# Patient Record
Sex: Male | Born: 1945 | Race: White | Hispanic: No | Marital: Single | Smoking: Current every day smoker
Health system: Southern US, Community
[De-identification: ages and names within clinical notes are randomized; demographics above are authoritative.]

## PROBLEM LIST (undated history)

## (undated) DIAGNOSIS — E119 Type 2 diabetes mellitus without complications: Secondary | ICD-10-CM

## (undated) DIAGNOSIS — I10 Essential (primary) hypertension: Secondary | ICD-10-CM

---

## 2004-10-29 ENCOUNTER — Ambulatory Visit: Payer: Self-pay | Admitting: Urology

## 2004-12-04 ENCOUNTER — Ambulatory Visit: Payer: Self-pay | Admitting: Radiation Oncology

## 2004-12-24 ENCOUNTER — Ambulatory Visit: Payer: Self-pay | Admitting: Radiation Oncology

## 2005-02-20 ENCOUNTER — Ambulatory Visit: Payer: Self-pay | Admitting: Unknown Physician Specialty

## 2005-12-03 ENCOUNTER — Ambulatory Visit: Payer: Self-pay | Admitting: Radiation Oncology

## 2005-12-24 ENCOUNTER — Ambulatory Visit: Payer: Self-pay | Admitting: Radiation Oncology

## 2006-09-09 ENCOUNTER — Ambulatory Visit: Payer: Self-pay | Admitting: Oncology

## 2006-09-23 ENCOUNTER — Ambulatory Visit: Payer: Self-pay | Admitting: Oncology

## 2007-09-15 ENCOUNTER — Ambulatory Visit: Payer: Self-pay | Admitting: Radiation Oncology

## 2007-09-24 ENCOUNTER — Ambulatory Visit: Payer: Self-pay | Admitting: Radiation Oncology

## 2007-11-02 ENCOUNTER — Emergency Department: Payer: Self-pay | Admitting: Emergency Medicine

## 2008-01-22 ENCOUNTER — Ambulatory Visit: Payer: Self-pay | Admitting: Radiation Oncology

## 2008-06-23 ENCOUNTER — Ambulatory Visit: Payer: Self-pay | Admitting: Radiation Oncology

## 2008-07-23 ENCOUNTER — Ambulatory Visit: Payer: Self-pay | Admitting: Unknown Physician Specialty

## 2008-09-13 ENCOUNTER — Ambulatory Visit: Payer: Self-pay | Admitting: Radiation Oncology

## 2008-09-23 ENCOUNTER — Ambulatory Visit: Payer: Self-pay | Admitting: Radiation Oncology

## 2008-12-31 ENCOUNTER — Ambulatory Visit (HOSPITAL_COMMUNITY): Admission: RE | Admit: 2008-12-31 | Discharge: 2009-01-01 | Payer: Self-pay | Admitting: Neurological Surgery

## 2009-01-28 ENCOUNTER — Encounter: Admission: RE | Admit: 2009-01-28 | Discharge: 2009-01-28 | Payer: Self-pay | Admitting: Neurological Surgery

## 2009-04-01 ENCOUNTER — Encounter: Admission: RE | Admit: 2009-04-01 | Discharge: 2009-04-01 | Payer: Self-pay | Admitting: Neurological Surgery

## 2009-08-23 ENCOUNTER — Ambulatory Visit: Payer: Self-pay | Admitting: Radiation Oncology

## 2009-09-19 ENCOUNTER — Ambulatory Visit: Payer: Self-pay | Admitting: Radiation Oncology

## 2009-09-23 ENCOUNTER — Ambulatory Visit: Payer: Self-pay | Admitting: Radiation Oncology

## 2010-09-17 ENCOUNTER — Ambulatory Visit: Payer: Self-pay | Admitting: Radiation Oncology

## 2010-09-23 ENCOUNTER — Ambulatory Visit: Payer: Self-pay | Admitting: Radiation Oncology

## 2011-03-10 LAB — DIFFERENTIAL
Basophils Relative: 1 % (ref 0–1)
Eosinophils Absolute: 0.2 10*3/uL (ref 0.0–0.7)
Monocytes Absolute: 0.5 10*3/uL (ref 0.1–1.0)
Monocytes Relative: 6 % (ref 3–12)
Neutrophils Relative %: 57 % (ref 43–77)

## 2011-03-10 LAB — CBC
MCHC: 34.8 g/dL (ref 30.0–36.0)
MCV: 95.8 fL (ref 78.0–100.0)
RBC: 4.06 MIL/uL — ABNORMAL LOW (ref 4.22–5.81)

## 2011-03-10 LAB — BASIC METABOLIC PANEL
CO2: 26 mEq/L (ref 19–32)
Chloride: 106 mEq/L (ref 96–112)
Creatinine, Ser: 0.83 mg/dL (ref 0.4–1.5)
GFR calc Af Amer: >60 mL/min (ref >60)
Glucose, Bld: 99 mg/dL (ref 70–99)

## 2011-03-10 LAB — PROTIME-INR: INR: 1 (ref 0.00–1.49)

## 2011-04-07 NOTE — Op Note (Signed)
NAMESHAHIL, SPEEGLE               ACCOUNT NO.:  [id number]   MEDICAL RECORD NO.:  [id number]          PATIENT TYPE:  INP   LOCATION:  NA                           FACILITY:  [facility]   PHYSICIAN:  Tia Alert, MD     DATE OF BIRTH:  05-Apr-1946   DATE OF PROCEDURE:  12/31/2008  DATE OF DISCHARGE:                               OPERATIVE REPORT   PREOPERATIVE DIAGNOSES:  Cervical spondylosis with cervical spinal  stenosis at C5-6 with cervical spondylosis and degenerative disk disease  C4-5 with cervical disk herniation C6-7, neck and right arm pain.   POSTOPERATIVE DIAGNOSES:  Cervical spondylosis with cervical spinal  stenosis at C5-6 with cervical spondylosis and degenerative disk disease  C4-5 with cervical disk herniation C6-7, neck and right arm pain.   PROCEDURE:  1. Decompressive anterior cervical diskectomy C4-5, C5-6, C6-7.  2. Anterior cervical arthrodesis C4-5, C5-6, C6-7 utilizing      corticocancellous allograft.  3. Anterior cervical plating C4-C7 inclusive utilizing a Sofamor Danek      translational plate.   SURGEON:  Tia Alert, MD   ASSISTANT:  Donalee Citrin, MD   ANESTHESIA:  General endotracheal.   COMPLICATIONS:  None apparent.   INDICATIONS FOR PROCEDURE:  Mr. Raben is a 65 year old gentleman, who  is referred with neck and right arm pain.  He had an MRI which showed  significant spondylosis at C4-5, C5-6, and C6-7.  There was significant  stenosis at C5-6.  There was severe spondylosis and degenerative disk  disease at C4-5 and there is a rightward disk herniation at C6-7.  He  had right arm pain.  He tried medical management for quite sometime  without significant relief.  I recommended a three-level anterior  cervical diskectomy fusion and plating from C4-C7.  He understood the  risks, benefits, expected outcome and wished to proceed.   DESCRIPTION OF PROCEDURE:  The patient was taken to the operating room  and after induction of adequate  generalized endotracheal anesthesia and  he was placed in supine position on the operating room table.  His right  anterior cervical region was prepped with DuraPrep and draped in usual  sterile fashion.  A 5 mL of local anesthesia was injected and a  transverse incision was made to the right of midline and carried down to  the platysma which was elevated, opened and then undermined with  Metzenbaum scissors.  I then dissected a plane medial to the  sternocleidomastoid muscle and internal carotid artery and lateral to  the trachea, esophagus to expose C4-5, C5-6, and C6-7.  Intraoperative  fluoroscopy confirmed my levels, then I took down the longus colli  muscles and placed the shadow line retractors under these to expose C4-  C7.  I incised the disk spaces of C5-6 and C6-7 and did the initial  diskectomy with pituitary rongeurs and curved curettes.  I then used a  high-speed drill to drill the endplates at each level down to the level  of the posterior longitudinal ligament and posterior osteophytes.  We  then brought in the operating microscope.  We  opened the posterior  longitudinal ligament with a nerve hook and then removed undercutting  the bodies of C5-6 and C6-7 to decompress the central canal.  Bilateral  foraminotomies were performed paying particular attention to the right  side because of his right-sided pain.  We marched along the superior  endplate of C6 and then C7 to identify the pedicles bilaterally and then  identified the nerve root and marched along the nerve root into the  foramina to assure adequate decompression of the nerve roots.  We then  irrigated with saline solution, inspected our decompression with both  visualization and with a circumferential palpation with a nerve hook.  We felt like we had a good decompression.  We could see the cord  pulsatile through the dura at C5-6, but dura was no longer pushed away  and appeared to be full and capacious all the way  across.  Therefore we  measured the interspaces to be 8 mm at C6-C7 and 7 mm at C5-6, used  corresponding corticocancellous allografts and tapped these into  position at these levels.  We then went to C4-5, incised the disk space,  did initial diskectomy with pituitary rongeurs and curved curettes and  then used a high-speed drill to drill the endplates.  This was a very  degenerated disk.  It was very collapsed.  Basically created in the disk  space with our drilling down to the level of the posterior longitudinal  ligament.  The operating microscope was brought into the field.  We were  able to undercut the spurs at C4 and at C5 and then performed bilateral  foraminotomies but this was more of a central decompression at this  level.  We then irrigated this level, inspected our decompression.  We  felt like we had a good decompression by undercutting the vertebral  bodies of C4 and C5.  The dura was full all the way across.  Bilateral  foraminotomies were performed.  We then measured the interspace to be 5  mm and tapped a 5-mm corticocancellous allograft into the interspace at  C4-5.  We then used a 62-mm plate and then utilizing AP and lateral  fluoroscopy, we placed this plate using two 13-mm fixed angle screws at  C7 and two 13-mm variable angle screws at C4 and C6.  At C5 I could only  get one screw and the other screw would not become flush with the plate,  and therefore it was removed and only a single screw was used.  We then  locked the screws into position.  I then spent considerable time  irrigating the wound, drying bleeding points with bipolar cautery and  with surgery foam and once meticulous hemostasis was achieved, I placed  a drain through a separate stab incision and then closed the platysma  with 3-0 Vicryl, closing subcuticular tissue with 3-0 Vicryl, and closed  the skin with Benzoin and Steri-Strips.  Drapes were removed.  A sterile  dressing was applied.  The  patient was awakened from general anesthesia  and transferred to recovery room in stable condition.  At the end of  procedure, all sponge, needle, and instruments counts were correct.      Tia Alert, MD  Electronically Signed     DSJ/MEDQ  D:  12/31/2008  T:  01/01/2009  Job:  [id number]

## 2015-04-02 ENCOUNTER — Emergency Department: Payer: Medicare Other

## 2015-04-02 ENCOUNTER — Emergency Department
Admission: EM | Admit: 2015-04-02 | Discharge: 2015-04-02 | Disposition: A | Discharge status: Home / Self Care | Payer: Medicare Other | Attending: Emergency Medicine | Admitting: Emergency Medicine

## 2015-04-02 ENCOUNTER — Other Ambulatory Visit: Payer: Self-pay

## 2015-04-02 ENCOUNTER — Encounter: Payer: Self-pay | Admitting: Emergency Medicine

## 2015-04-02 DIAGNOSIS — Z720 Tobacco use: Secondary | ICD-10-CM | POA: Insufficient documentation

## 2015-04-02 DIAGNOSIS — I10 Essential (primary) hypertension: Secondary | ICD-10-CM | POA: Insufficient documentation

## 2015-04-02 DIAGNOSIS — R0602 Shortness of breath: Secondary | ICD-10-CM | POA: Diagnosis present

## 2015-04-02 HISTORY — DX: Essential (primary) hypertension: I10

## 2015-04-02 LAB — BASIC METABOLIC PANEL
Anion gap: 9 (ref 5–15)
BUN: 24 mg/dL — AB (ref 6–20)
CALCIUM: 9.7 mg/dL (ref 8.9–10.3)
CHLORIDE: 106 mmol/L (ref 101–111)
CO2: 24 mmol/L (ref 22–32)
CREATININE: 1.13 mg/dL (ref 0.61–1.24)
GFR calc non Af Amer: >60 mL/min (ref >60)
Glucose, Bld: 155 mg/dL — ABNORMAL HIGH (ref 65–99)
Potassium: 3.9 mmol/L (ref 3.5–5.1)
Sodium: 139 mmol/L (ref 135–145)

## 2015-04-02 LAB — CBC
HEMATOCRIT: 38.5 % — AB (ref 40.0–52.0)
Hemoglobin: 13.2 g/dL (ref 13.0–18.0)
MCH: 33.2 pg (ref 26.0–34.0)
MCHC: 34.2 g/dL (ref 32.0–36.0)
MCV: 97 fL (ref 80.0–100.0)
Platelets: 154 10*3/uL (ref 150–440)
RBC: 3.97 MIL/uL — ABNORMAL LOW (ref 4.40–5.90)
RDW: 13.1 % (ref 11.5–14.5)
WBC: 7.9 10*3/uL (ref 3.8–10.6)

## 2015-04-02 LAB — TROPONIN I: Troponin I: <0.03 ng/mL (ref <0.031)

## 2015-04-02 LAB — BRAIN NATRIURETIC PEPTIDE: B Natriuretic Peptide: 65 pg/mL (ref 0.0–100.0)

## 2015-04-02 NOTE — ED Notes (Signed)
Patient ambulatory to triage with steady gait, without difficulty or distress noted; pt reports awakening at 1am to go to [facility] and noted SOB that increased when lying supine; denies any recent illness, denies cough; denies hx of same

## 2019-09-14 ENCOUNTER — Emergency Department
Admission: EM | Admit: 2019-09-14 | Discharge: 2019-09-14 | Disposition: A | Discharge status: Home / Self Care | Payer: Medicare Other | Attending: Emergency Medicine | Admitting: Emergency Medicine

## 2019-09-14 ENCOUNTER — Encounter: Payer: Self-pay | Admitting: Emergency Medicine

## 2019-09-14 ENCOUNTER — Other Ambulatory Visit: Payer: Self-pay

## 2019-09-14 ENCOUNTER — Emergency Department: Payer: Medicare Other

## 2019-09-14 DIAGNOSIS — Y9389 Activity, other specified: Secondary | ICD-10-CM | POA: Diagnosis not present

## 2019-09-14 DIAGNOSIS — S51812A Laceration without foreign body of left forearm, initial encounter: Secondary | ICD-10-CM | POA: Insufficient documentation

## 2019-09-14 DIAGNOSIS — Z7982 Long term (current) use of aspirin: Secondary | ICD-10-CM | POA: Insufficient documentation

## 2019-09-14 DIAGNOSIS — I10 Essential (primary) hypertension: Secondary | ICD-10-CM | POA: Insufficient documentation

## 2019-09-14 DIAGNOSIS — Y998 Other external cause status: Secondary | ICD-10-CM | POA: Insufficient documentation

## 2019-09-14 DIAGNOSIS — W293XXA Contact with powered garden and outdoor hand tools and machinery, initial encounter: Secondary | ICD-10-CM | POA: Insufficient documentation

## 2019-09-14 DIAGNOSIS — Z79899 Other long term (current) drug therapy: Secondary | ICD-10-CM | POA: Diagnosis not present

## 2019-09-14 DIAGNOSIS — E119 Type 2 diabetes mellitus without complications: Secondary | ICD-10-CM | POA: Diagnosis not present

## 2019-09-14 DIAGNOSIS — S59912A Unspecified injury of left forearm, initial encounter: Secondary | ICD-10-CM | POA: Diagnosis present

## 2019-09-14 DIAGNOSIS — F17210 Nicotine dependence, cigarettes, uncomplicated: Secondary | ICD-10-CM | POA: Diagnosis not present

## 2019-09-14 DIAGNOSIS — Y929 Unspecified place or not applicable: Secondary | ICD-10-CM | POA: Insufficient documentation

## 2019-09-14 DIAGNOSIS — Z23 Encounter for immunization: Secondary | ICD-10-CM | POA: Diagnosis not present

## 2019-09-14 HISTORY — DX: Type 2 diabetes mellitus without complications: E11.9

## 2019-09-14 MED ORDER — TETANUS-DIPHTH-ACELL PERTUSSIS 5-2.5-18.5 LF-MCG/0.5 IM SUSP
0.5000 mL | Freq: Once | INTRAMUSCULAR | Status: AC
  Administered 2019-09-14: 0.5 mL via INTRAMUSCULAR
  Filled 2019-09-14: qty 0.5

## 2019-09-14 MED ORDER — TRAMADOL HCL 50 MG PO TABS: 50.0000 mg | ORAL_TABLET | Freq: Four times a day (QID) | ORAL | 0 refills | Status: AC | PRN

## 2019-09-14 MED ORDER — SULFAMETHOXAZOLE-TRIMETHOPRIM 800-160 MG PO TABS: 1.0000 | ORAL_TABLET | Freq: Two times a day (BID) | ORAL | 0 refills | Status: AC

## 2019-09-14 MED ORDER — TRAMADOL HCL 50 MG PO TABS
50.0000 mg | ORAL_TABLET | Freq: Once | ORAL | Status: AC
  Administered 2019-09-14: 50 mg via ORAL
  Filled 2019-09-14: qty 1

## 2019-09-14 MED ORDER — LIDOCAINE HCL (PF) 1 % IJ SOLN
5.0000 mL | Freq: Once | INTRAMUSCULAR | Status: AC
  Administered 2019-09-14: 5 mL
  Filled 2019-09-14: qty 5

## 2019-09-14 MED ORDER — BACITRACIN-NEOMYCIN-POLYMYXIN 400-5-5000 EX OINT
TOPICAL_OINTMENT | Freq: Once | CUTANEOUS | Status: AC
  Administered 2019-09-14: 1 via TOPICAL
  Filled 2019-09-14: qty 1

## 2019-09-14 NOTE — ED Provider Notes (Signed)
[facility] Emergency Department Provider Note   ____________________________________________   First MD Initiated Contact with Patient 09/14/19 1129     (approximate)  I have reviewed the triage vital signs and the nursing notes.   HISTORY  Chief Complaint Extremity Laceration    HPI Ricky Crawford is a 73 y.o. male patient presents with laceration to left forearm secondary to pain saw.  Patient has loss sensation loss of function.  Bleeding is controlled with direct pressure.  Patient rates pain as a 5/10.  Patient described pain is "aching". Patient is right-hand dominant.  No other palliative measure prior to arrival.  Patient tetanus shot is not up-to-date.     Past Medical History:  Diagnosis Date  . Diabetes mellitus without complication (HCC)   . Hypertension     There are no active problems to display for this patient.   History reviewed. No pertinent surgical history.  Prior to Admission medications   Medication Sig Start Date End Date Taking? Authorizing Provider  aspirin 81 MG chewable tablet Chew 81 mg by mouth daily.   Yes [provider]  atorvastatin (LIPITOR) 80 MG tablet Take 80 mg by mouth daily.   Yes [provider]  lisinopril (ZESTRIL) 20 MG tablet Take 20 mg by mouth daily.   Yes [provider]  losartan (COZAAR) 50 MG tablet Take 50 mg by mouth daily.   Yes [provider]  sulfamethoxazole-trimethoprim (BACTRIM DS) 800-160 MG tablet Take 1 tablet by mouth 2 (two) times daily. 09/14/19   Joni Reining, PA-C  traMADol (ULTRAM) 50 MG tablet Take 1 tablet (50 mg total) by mouth every 6 (six) hours as needed for up to 3 days. 09/14/19 09/17/19  Joni Reining, PA-C    Allergies Metformin and related  No family history on file.  Social History Social History   Tobacco Use  . Smoking status: Current Every Day Smoker    Packs/day: 1.00    Types: Cigarettes  . Smokeless tobacco:  Never Used  Substance Use Topics  . Alcohol use: Yes  . Drug use: No    Review of Systems  Constitutional: No fever/chills Eyes: No visual changes.  Patient legally blind. ENT: No sore throat. Cardiovascular: Denies chest pain. Respiratory: Denies shortness of breath. Gastrointestinal: No abdominal pain.  No nausea, no vomiting.  No diarrhea.  No constipation. Genitourinary: Negative for dysuria. Musculoskeletal: Negative for back pain. Skin: Negative for rash. Neurological: Negative for headaches, focal weakness or numbness. Endocrine:  Hypertension. Allergic/Immunilogical: Metformin. ____________________________________________   PHYSICAL EXAM:  VITAL SIGNS: ED Triage Vitals  Enc Vitals Group     BP 09/14/19 1054 (!) 127/58     Pulse Rate 09/14/19 1054 71     Resp 09/14/19 1054 18     Temp 09/14/19 1054 98 F (36.7 C)     Temp Source 09/14/19 1054 Oral     SpO2 09/14/19 1054 97 %     Weight 09/14/19 1045 224 lb (101.6 kg)     Height 09/14/19 1045 5\' 9"  (1.753 m)     Head Circumference --      Peak Flow --      Pain Score 09/14/19 1045 5     Pain Loc --      Pain Edu? --      Excl. in [place]? --    Constitutional: Alert and oriented. Well appearing and in no acute distress. Cardiovascular: Normal rate, regular rhythm. Grossly normal heart sounds.  Good peripheral circulation. Respiratory: Normal respiratory effort.  No retractions. Lungs CTAB. Musculoskeletal: No lower extremity tenderness nor edema.  No joint effusions. Neurologic:  Normal speech and language. No gross focal neurologic deficits are appreciated. No gait instability. Skin:  Skin is warm, dry and intact. No rash noted.  4 cm laceration left forearm. Psychiatric: Mood and affect are normal. Speech and behavior are normal.  ____________________________________________   LABS (all labs ordered are listed, but only abnormal results are displayed)  Labs Reviewed - No data to display  ____________________________________________  EKG   ____________________________________________  RADIOLOGY  ED MD interpretation:    Official radiology report(s): Dg Forearm Left  Result Date: 09/14/2019 CLINICAL DATA:  Laceration to the left forearm with a chain saw. EXAM: LEFT FOREARM - 2 VIEW COMPARISON:  09/14/2019 FINDINGS: The bones appear normal. No appreciable joint effusions. On the lateral view there is an 8 mm oval soft tissue density which likely represents something in the dressing but could represent an unusual foreign body. IMPRESSION: Possible foreign body in the soft tissues of the forearm. The bones appear normal. Electronically Signed   By: Francene BoyersJames  Maxwell M.D.   On: 09/14/2019 11:55    ____________________________________________   PROCEDURES  Procedure(s) performed (including Critical Care):  Marland Kitchen.Marland Kitchen.Laceration Repair  Date/Time: 09/14/2019 12:39 PM Performed by: Joni ReiningSmith, Chealsey Miyamoto K, PA-C Authorized by: Joni ReiningSmith, Rorey Bisson K, PA-C   Consent:    Consent obtained:  Verbal   Consent given by:  Patient   Risks discussed:  Infection, pain, retained foreign body, poor cosmetic result and need for additional repair Anesthesia (see MAR for exact dosages):    Anesthesia method:  Local infiltration   Local anesthetic:  Lidocaine 1% w/o epi Laceration details:    Location:  Shoulder/arm   Shoulder/arm location:  L lower arm   Length (cm):  6   Depth (mm):  2 Repair type:    Repair type:  Simple Pre-procedure details:    Preparation:  Patient was prepped and draped in usual sterile fashion and imaging obtained to evaluate for foreign bodies Exploration:    Hemostasis achieved with:  Direct pressure   Wound exploration: wound explored through full range of motion     Contaminated: yes   Treatment:    Area cleansed with:  Betadine and saline   Amount of cleaning:  Extensive   Irrigation solution:  Sterile saline   Irrigation method:  Syringe   Visualized foreign  bodies/material removed: no   Skin repair:    Repair method:  Sutures   Suture size:  3-0   Suture material:  Nylon   Suture technique:  Simple interrupted   Number of sutures:  11 Approximation:    Approximation:  Close Post-procedure details:    Dressing:  Antibiotic ointment and sterile dressing   Patient tolerance of procedure:  Tolerated well, no immediate complications     ____________________________________________   INITIAL IMPRESSION / ASSESSMENT AND PLAN / ED COURSE  As part of my medical decision making, I reviewed the following data within the electronic MEDICAL RECORD NUMBER      Patient presents with forearm laceration secondary to a chainsaw cut.  Discussed x-ray finds with patient.  Foreign body was in dressing of patient.  See procedure note for wound closure.  Patient given discharge care instruction advised take medication as directed.  Patient advised to return in 10 days have sutures removed.         ____________________________________________   FINAL CLINICAL IMPRESSION(S) / ED  DIAGNOSES  Final diagnoses:  Forearm laceration, left, initial encounter     ED Discharge Orders         Ordered    traMADol (ULTRAM) 50 MG tablet  Every 6 hours PRN     09/14/19 1228    sulfamethoxazole-trimethoprim (BACTRIM DS) 800-160 MG tablet  2 times daily     09/14/19 1228           Note:  This document was prepared using Dragon voice recognition software and may include unintentional dictation errors.    Sable Feil, PA-C 09/14/19 1241    Earleen Newport, MD 09/14/19 1328

## 2019-09-14 NOTE — ED Triage Notes (Signed)
Presents with laceration to left f/a  States he was using a chain saw and it kicked back

## 2019-09-14 NOTE — Discharge Instructions (Addendum)
Follow discharge care instruction take medication as directed.  Return back in 10 days for suture removal. 

## 2019-09-25 ENCOUNTER — Other Ambulatory Visit: Payer: Self-pay

## 2019-09-25 ENCOUNTER — Encounter: Payer: Self-pay | Admitting: Emergency Medicine

## 2019-09-25 ENCOUNTER — Emergency Department
Admission: EM | Admit: 2019-09-25 | Discharge: 2019-09-25 | Disposition: A | Discharge status: Home / Self Care | Payer: No Typology Code available for payment source | Attending: Emergency Medicine | Admitting: Emergency Medicine

## 2019-09-25 DIAGNOSIS — F17210 Nicotine dependence, cigarettes, uncomplicated: Secondary | ICD-10-CM | POA: Diagnosis not present

## 2019-09-25 DIAGNOSIS — Y69 Unspecified misadventure during surgical and medical care: Secondary | ICD-10-CM | POA: Insufficient documentation

## 2019-09-25 DIAGNOSIS — Z7982 Long term (current) use of aspirin: Secondary | ICD-10-CM | POA: Insufficient documentation

## 2019-09-25 DIAGNOSIS — Z4800 Encounter for change or removal of nonsurgical wound dressing: Secondary | ICD-10-CM | POA: Diagnosis present

## 2019-09-25 DIAGNOSIS — E119 Type 2 diabetes mellitus without complications: Secondary | ICD-10-CM | POA: Diagnosis not present

## 2019-09-25 DIAGNOSIS — Z79899 Other long term (current) drug therapy: Secondary | ICD-10-CM | POA: Diagnosis not present

## 2019-09-25 DIAGNOSIS — T8130XA Disruption of wound, unspecified, initial encounter: Secondary | ICD-10-CM

## 2019-09-25 DIAGNOSIS — I10 Essential (primary) hypertension: Secondary | ICD-10-CM | POA: Insufficient documentation

## 2019-09-25 DIAGNOSIS — T8133XA Disruption of traumatic injury wound repair, initial encounter: Secondary | ICD-10-CM | POA: Diagnosis not present

## 2019-09-25 MED ORDER — CLINDAMYCIN PHOSPHATE 600 MG/4ML IJ SOLN
600.0000 mg | Freq: Once | INTRAMUSCULAR | Status: AC
  Administered 2019-09-25: 23:00:00 600 mg via INTRAMUSCULAR
  Filled 2019-09-25: qty 4

## 2019-09-25 MED ORDER — CLINDAMYCIN HCL 300 MG PO CAPS: 300.0000 mg | ORAL_CAPSULE | Freq: Three times a day (TID) | ORAL | 0 refills | Status: DC

## 2019-09-25 MED ORDER — CLINDAMYCIN HCL 300 MG PO CAPS: 300.0000 mg | ORAL_CAPSULE | Freq: Three times a day (TID) | ORAL | 0 refills | Status: AC

## 2019-09-25 NOTE — ED Provider Notes (Signed)
[facility] Emergency Department Provider Note  ____________________________________________  Time seen: Approximately 10:54 PM  I have reviewed the triage vital signs and the nursing notes.   HISTORY  Chief Complaint Wound Check    HPI Ricky Crawford is a 73 y.o. male that presents to the emergency department for evaluation of laceration that was repaired in the emergency department 11 days ago.  Patient states that his daughter took sutures out today and wound opened back up.  He went to urgent care and was referred to the emergency department for further evaluation.  Wound was right cleaned at urgent care.  Wound is not painful.  He has not noticed any drainage from site.  No fevers.  Patient has been taking his Bactrim as prescribed and has 1 dose left.  Patient is diabetic and his sugars are controlled with diet.   Past Medical History:  Diagnosis Date  . Diabetes mellitus without complication ([place])   . Hypertension     There are no active problems to display for this patient.   History reviewed. No pertinent surgical history.  Prior to Admission medications   Medication Sig Start Date End Date Taking? Authorizing Provider  aspirin 81 MG chewable tablet Chew 81 mg by mouth daily.    [provider]  atorvastatin (LIPITOR) 80 MG tablet Take 80 mg by mouth daily.    [provider]  clindamycin (CLEOCIN) 300 MG capsule Take 1 capsule (300 mg total) by mouth 3 (three) times daily for 10 days. 09/25/19 10/05/19  Laban Emperor, PA-C  lisinopril (ZESTRIL) 20 MG tablet Take 20 mg by mouth daily.    [provider]  losartan (COZAAR) 50 MG tablet Take 50 mg by mouth daily.    [provider]  sulfamethoxazole-trimethoprim (BACTRIM DS) 800-160 MG tablet Take 1 tablet by mouth 2 (two) times daily. 09/14/19   Sable Feil, PA-C    Allergies Metformin and related  No family history on file.  Social History Social  History   Tobacco Use  . Smoking status: Current Every Day Smoker    Packs/day: 1.00    Types: Cigarettes  . Smokeless tobacco: Never Used  Substance Use Topics  . Alcohol use: Yes  . Drug use: No     Review of Systems  Constitutional: No fever/chills Gastrointestinal: No nausea, no vomiting.  Musculoskeletal: Negative for musculoskeletal pain. Skin: Negative for rash, ecchymosis. Positive for left forearm wound. Neurological: Negative for numbness or tingling   ____________________________________________   PHYSICAL EXAM:  VITAL SIGNS: ED Triage Vitals  Enc Vitals Group     BP 09/25/19 2042 (!) 145/85     Pulse Rate 09/25/19 2042 60     Resp 09/25/19 2042 16     Temp 09/25/19 2042 98.3 F (36.8 C)     Temp Source 09/25/19 2042 Oral     SpO2 09/25/19 2042 99 %     Weight 09/25/19 2043 218 lb (98.9 kg)     Height 09/25/19 2043 5\' 9"  (1.753 m)     Head Circumference --      Peak Flow --      Pain Score 09/25/19 2043 0     Pain Loc --      Pain Edu? --      Excl. in [place]? --      Constitutional: Alert and oriented. Well appearing and in no acute distress. Eyes: Conjunctivae are normal. PERRL. EOMI. Head: Atraumatic. ENT:  Ears:      Nose: No congestion/rhinnorhea.      Mouth/Throat: Mucous membranes are moist.  Neck: No stridor. Cardiovascular: Normal rate, regular rhythm.  Good peripheral circulation. Respiratory: Normal respiratory effort without tachypnea or retractions. Lungs CTAB. Good air entry to the bases with no decreased or absent breath sounds. Musculoskeletal: Full range of motion to all extremities. No gross deformities appreciated. Neurologic:  Normal speech and language. No gross focal neurologic deficits are appreciated.  Skin:  Skin is warm, dry.  Wound dehiscence to left forearm laceration.  Mild surrounding erythema.  No streaking.  No noted drainage. Psychiatric: Mood and affect are normal. Speech and behavior are normal. Patient exhibits  appropriate insight and judgement.   ____________________________________________   LABS (all labs ordered are listed, but only abnormal results are displayed)  Labs Reviewed - No data to display ____________________________________________  EKG   ____________________________________________  RADIOLOGY   No results found.  ____________________________________________    PROCEDURES  Procedure(s) performed:    Procedures  LACERATION REPAIR Performed by: Enid Derry  Consent: Verbal consent obtained.  Consent given by: patient  Prepped and Draped in normal sterile fashion  Wound explored: No foreign bodies   Laceration Location: forearm  Laceration Length: 4 cm  Anesthesia: None  Local anesthetic: None  Skin closure: steri strips  Number of strips: 3  Technique: Simple interrupted  Patient tolerance: Patient tolerated the procedure well with no immediate complications.  Medications  clindamycin (CLEOCIN) injection 600 mg (has no administration in time range)     ____________________________________________   INITIAL IMPRESSION / ASSESSMENT AND PLAN / ED COURSE  Pertinent labs & imaging results that were available during my care of the patient were reviewed by me and considered in my medical decision making (see chart for details).  Review of the [place] CSRS was performed in accordance of the [facility] prior to dispensing any controlled drugs.   Patient's diagnosis is consistent with wound dehiscence.  Steri-Strips were placed.  Patient will be discharged home with prescriptions for clindamycin. Patient is to follow up with wound clinic as directed. Patient is given ED precautions to return to the ED for any worsening or new symptoms.  Ricky Crawford was evaluated in Emergency Department on 09/25/2019 for the symptoms described in the history of present illness. He was evaluated in the context of the global COVID-19 pandemic, which necessitated  consideration that the patient might be at risk for infection with the SARS-CoV-2 virus that causes COVID-19. Institutional protocols and algorithms that pertain to the evaluation of patients at risk for COVID-19 are in a state of rapid change based on information released by regulatory bodies including the CDC and federal and state organizations. These policies and algorithms were followed during the patient's care in the ED.   ____________________________________________  FINAL CLINICAL IMPRESSION(S) / ED DIAGNOSES  Final diagnoses:  Wound dehiscence      NEW MEDICATIONS STARTED DURING THIS VISIT:  ED Discharge Orders         Ordered    clindamycin (CLEOCIN) 300 MG capsule  3 times daily     09/25/19 2306              This chart was dictated using voice recognition software/Dragon. Despite best efforts to proofread, errors can occur which can change the meaning. Any change was purely unintentional.    Enid Derry, PA-C 09/25/19 2348    Phineas Semen, MD 09/25/19 2352

## 2019-09-25 NOTE — ED Triage Notes (Signed)
Pt reports he was in ER about 11 days ago due to laceration to left forearm got 11 stiches and was supposed to get stiches removed yesterday, pt went to urgent care and was sent to ER for further evaluation pt's daughter removed stiches from pt's arm at home and laceration area is opened. Redness noted to area no discharge; pt denies any pain to area present. Pt talks in complete sentences

## 2019-09-25 NOTE — ED Notes (Signed)

## 2019-09-27 ENCOUNTER — Ambulatory Visit: Payer: No Typology Code available for payment source | Admitting: Internal Medicine

## 2019-10-05 ENCOUNTER — Ambulatory Visit: Payer: No Typology Code available for payment source | Admitting: Physician Assistant

## 2020-02-20 IMAGING — DX DG FOREARM 2V*L*
2 series · 2 of 2 positions shown · non-contrast
Comparison: 09/14/2019

CLINICAL DATA: Laceration to the left forearm with a chain saw.

EXAM:
LEFT FOREARM - 2 VIEW

[forearm ap]
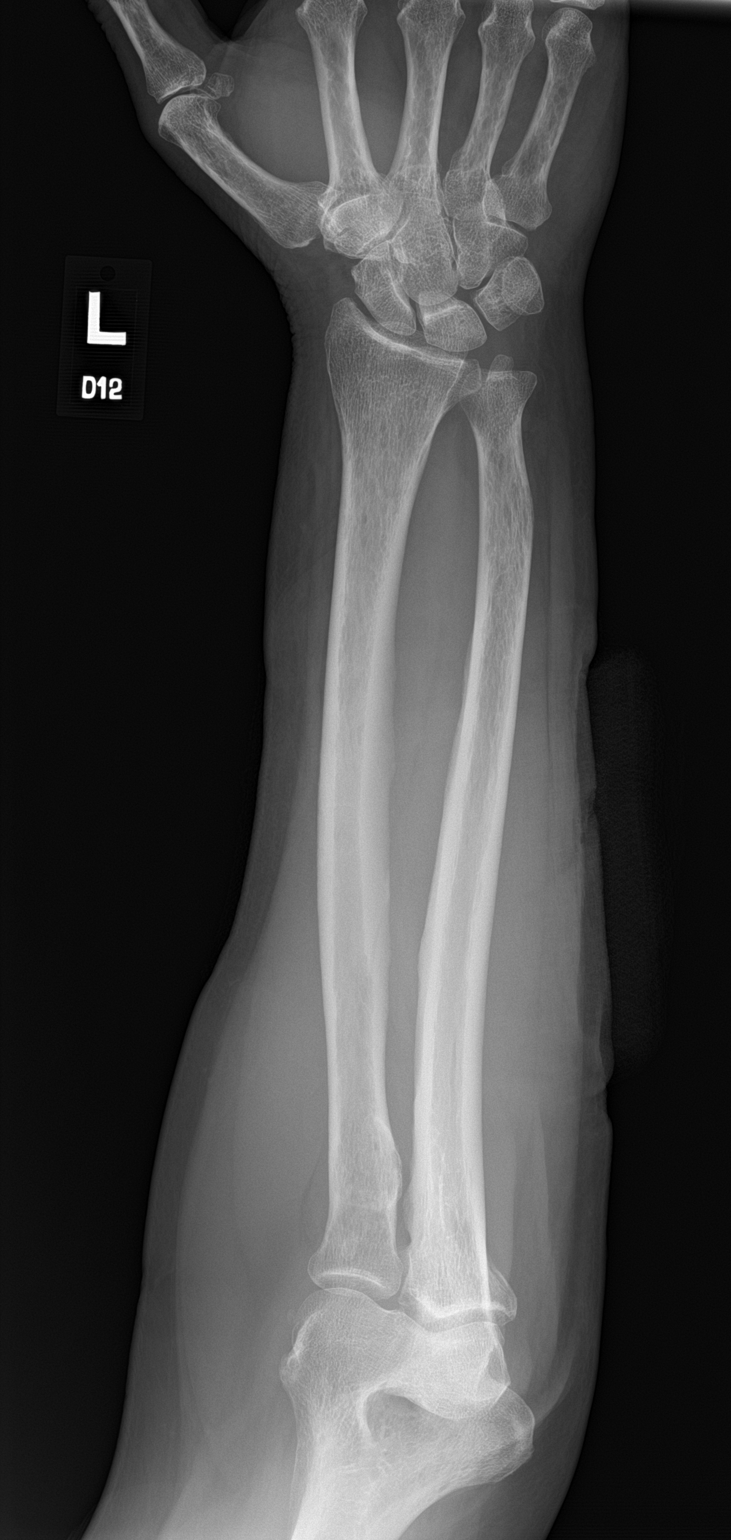

[forearm lat]
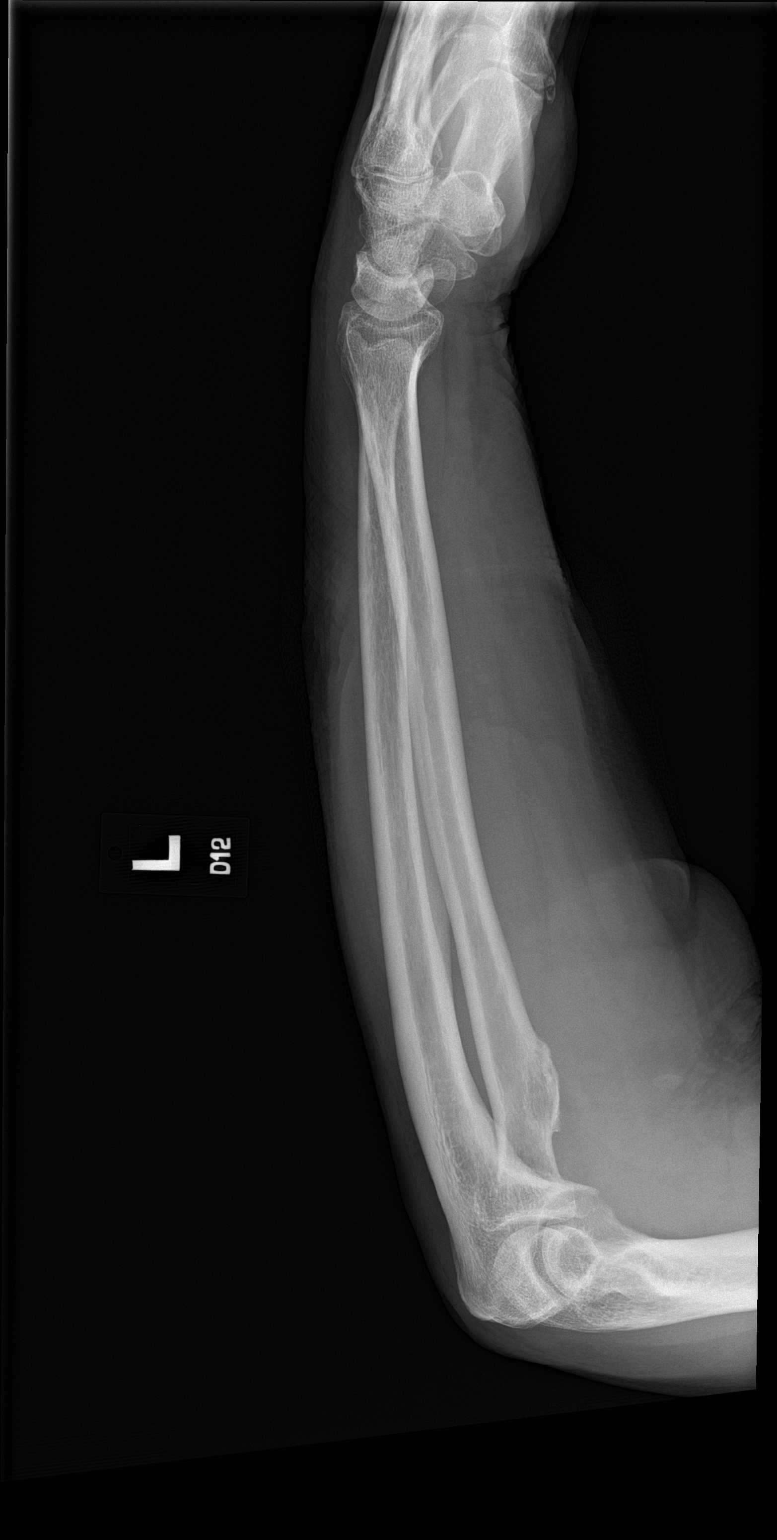

[2 of 2 positions shown; findings below may reference images not displayed]

FINDINGS: The bones appear normal. No appreciable joint effusions. On the
lateral view there is an 8 mm oval soft tissue density which likely
represents something in the dressing but could represent an unusual
foreign body.
IMPRESSION: Possible foreign body in the soft tissues of the forearm. The bones
appear normal.

## 2023-01-22 ENCOUNTER — Encounter: Payer: Self-pay | Admitting: Podiatry

## 2023-01-22 ENCOUNTER — Ambulatory Visit: Payer: Medicare Other | Admitting: Podiatry

## 2023-01-22 DIAGNOSIS — M79675 Pain in left toe(s): Secondary | ICD-10-CM | POA: Diagnosis not present

## 2023-01-22 DIAGNOSIS — B351 Tinea unguium: Secondary | ICD-10-CM

## 2023-01-22 DIAGNOSIS — M79674 Pain in right toe(s): Secondary | ICD-10-CM | POA: Diagnosis not present

## 2023-01-22 NOTE — Progress Notes (Signed)
  Subjective:  Patient ID: Ricky Crawford, male    DOB: 11/13/1946,  MRN: [id number]  Chief Complaint  Patient presents with   Nail Problem   77 y.o. male returns for the above complaint.  Patient presents with thickened elongated dystrophic toenails x 10 mild pain on palpation hurts with ambulation hurts with pressure he would like for me to debride down has not able to do it himself  Objective:  There were no vitals filed for this visit. Podiatric Exam: Vascular: dorsalis pedis and posterior tibial pulses are palpable bilateral. Capillary return is immediate. Temperature gradient is WNL. Skin turgor WNL  Sensorium: Normal Semmes Weinstein monofilament test. Normal tactile sensation bilaterally. Nail Exam: Pt has thick disfigured discolored nails with subungual debris noted bilateral entire nail hallux through fifth toenails.  Pain on palpation to the nails. Ulcer Exam: There is no evidence of ulcer or pre-ulcerative changes or infection. Orthopedic Exam: Muscle tone and strength are WNL. No limitations in general ROM. No crepitus or effusions noted.  Skin: No Porokeratosis. No infection or ulcers    Assessment & Plan:   1. Pain due to onychomycosis of toenails of both feet     Patient was evaluated and treated and all questions answered.  Onychomycosis with pain  -Nails palliatively debrided as below. -Educated on self-care  Procedure: Nail Debridement Rationale: pain  Type of Debridement: manual, sharp debridement. Instrumentation: Nail nipper, rotary burr. Number of Nails: 10  Procedures and Treatment: Consent by patient was obtained for treatment procedures. The patient understood the discussion of treatment and procedures well. All questions were answered thoroughly reviewed. Debridement of mycotic and hypertrophic toenails, 1 through 5 bilateral and clearing of subungual debris. No ulceration, no infection noted.  Return Visit-Office Procedure: Patient instructed to  return to the office for a follow up visit 3 months for continued evaluation and treatment.  Boneta Lucks, DPM    No follow-ups on file.

## 2023-07-06 ENCOUNTER — Ambulatory Visit: Payer: Medicare Other | Admitting: Podiatry

## 2023-07-06 DIAGNOSIS — M79675 Pain in left toe(s): Secondary | ICD-10-CM | POA: Diagnosis not present

## 2023-07-06 DIAGNOSIS — M79674 Pain in right toe(s): Secondary | ICD-10-CM | POA: Diagnosis not present

## 2023-07-06 DIAGNOSIS — B351 Tinea unguium: Secondary | ICD-10-CM | POA: Diagnosis not present

## 2023-07-06 NOTE — Progress Notes (Signed)
  Subjective:  Patient ID: Ricky Crawford, male    DOB: 1946/11/12,  MRN: [id number]  Chief Complaint  Patient presents with   Nail Problem   77 y.o. male returns for the above complaint.  Patient presents with thickened elongated dystrophic toenails x 10 mild pain on palpation hurts with ambulation hurts with pressure he would like for me to debride down has not able to do it himself  Objective:  There were no vitals filed for this visit. Podiatric Exam: Vascular: dorsalis pedis and posterior tibial pulses are palpable bilateral. Capillary return is immediate. Temperature gradient is WNL. Skin turgor WNL  Sensorium: Normal Semmes Weinstein monofilament test. Normal tactile sensation bilaterally. Nail Exam: Pt has thick disfigured discolored nails with subungual debris noted bilateral entire nail hallux through fifth toenails.  Pain on palpation to the nails. Ulcer Exam: There is no evidence of ulcer or pre-ulcerative changes or infection. Orthopedic Exam: Muscle tone and strength are WNL. No limitations in general ROM. No crepitus or effusions noted.  Skin: No Porokeratosis. No infection or ulcers    Assessment & Plan:   No diagnosis found.   Patient was evaluated and treated and all questions answered.  Onychomycosis with pain  -Nails palliatively debrided as below. -Educated on self-care  Procedure: Nail Debridement Rationale: pain  Type of Debridement: manual, sharp debridement. Instrumentation: Nail nipper, rotary burr. Number of Nails: 10  Procedures and Treatment: Consent by patient was obtained for treatment procedures. The patient understood the discussion of treatment and procedures well. All questions were answered thoroughly reviewed. Debridement of mycotic and hypertrophic toenails, 1 through 5 bilateral and clearing of subungual debris. No ulceration, no infection noted.  Return Visit-Office Procedure: Patient instructed to return to the office for a follow up  visit 3 months for continued evaluation and treatment.  Nicholes Rough, DPM    No follow-ups on file.

## 2023-11-02 ENCOUNTER — Ambulatory Visit: Payer: Medicare Other | Admitting: Podiatry

## 2023-11-02 ENCOUNTER — Encounter: Payer: Self-pay | Admitting: Podiatry

## 2023-11-02 VITALS — Ht 69.0 in | Wt 218.0 lb

## 2023-11-02 DIAGNOSIS — M79675 Pain in left toe(s): Secondary | ICD-10-CM

## 2023-11-02 DIAGNOSIS — B351 Tinea unguium: Secondary | ICD-10-CM | POA: Diagnosis not present

## 2023-11-02 DIAGNOSIS — M79674 Pain in right toe(s): Secondary | ICD-10-CM

## 2023-11-02 NOTE — Progress Notes (Signed)
  Subjective:  Patient ID: Ricky Crawford, male    DOB: 13-Jun-1946,  MRN: [id number]  No chief complaint on file.  77 y.o. male returns for the above complaint.  Patient presents with thickened elongated dystrophic toenails x 10 mild pain on palpation hurts with ambulation hurts with pressure he would like for me to debride down has not able to do it himself  Objective:  There were no vitals filed for this visit. Podiatric Exam: Vascular: dorsalis pedis and posterior tibial pulses are palpable bilateral. Capillary return is immediate. Temperature gradient is WNL. Skin turgor WNL  Sensorium: Normal Semmes Weinstein monofilament test. Normal tactile sensation bilaterally. Nail Exam: Pt has thick disfigured discolored nails with subungual debris noted bilateral entire nail hallux through fifth toenails.  Pain on palpation to the nails. Ulcer Exam: There is no evidence of ulcer or pre-ulcerative changes or infection. Orthopedic Exam: Muscle tone and strength are WNL. No limitations in general ROM. No crepitus or effusions noted.  Skin: No Porokeratosis. No infection or ulcers    Assessment & Plan:   1. Pain due to onychomycosis of toenails of both feet      Patient was evaluated and treated and all questions answered.  Onychomycosis with pain  -Nails palliatively debrided as below. -Educated on self-care  Procedure: Nail Debridement Rationale: pain  Type of Debridement: manual, sharp debridement. Instrumentation: Nail nipper, rotary burr. Number of Nails: 10  Procedures and Treatment: Consent by patient was obtained for treatment procedures. The patient understood the discussion of treatment and procedures well. All questions were answered thoroughly reviewed. Debridement of mycotic and hypertrophic toenails, 1 through 5 bilateral and clearing of subungual debris. No ulceration, no infection noted.  Return Visit-Office Procedure: Patient instructed to return to the office for a  follow up visit 3 months for continued evaluation and treatment.  Nicholes Rough, DPM    No follow-ups on file.

## 2024-02-01 ENCOUNTER — Ambulatory Visit: Payer: Medicare Other | Admitting: Podiatry

## 2024-02-01 DIAGNOSIS — B351 Tinea unguium: Secondary | ICD-10-CM | POA: Diagnosis not present

## 2024-02-01 DIAGNOSIS — M79674 Pain in right toe(s): Secondary | ICD-10-CM | POA: Diagnosis not present

## 2024-02-01 DIAGNOSIS — M79675 Pain in left toe(s): Secondary | ICD-10-CM

## 2024-02-01 NOTE — Progress Notes (Signed)
  Subjective:  Patient ID: Ricky Crawford, male    DOB: [birth year],  MRN: [id number]  Chief Complaint  Patient presents with   Nail Problem    Nail trim   78 y.o. male returns for the above complaint.  Patient presents with thickened elongated dystrophic toenails x 10 mild pain on palpation hurts with ambulation hurts with pressure he would like for me to debride down has not able to do it himself  Objective:  There were no vitals filed for this visit. Podiatric Exam: Vascular: dorsalis pedis and posterior tibial pulses are palpable bilateral. Capillary return is immediate. Temperature gradient is WNL. Skin turgor WNL  Sensorium: Normal Semmes Weinstein monofilament test. Normal tactile sensation bilaterally. Nail Exam: Pt has thick disfigured discolored nails with subungual debris noted bilateral entire nail hallux through fifth toenails.  Pain on palpation to the nails. Ulcer Exam: There is no evidence of ulcer or pre-ulcerative changes or infection. Orthopedic Exam: Muscle tone and strength are WNL. No limitations in general ROM. No crepitus or effusions noted.  Skin: No Porokeratosis. No infection or ulcers    Assessment & Plan:   No diagnosis found.    Patient was evaluated and treated and all questions answered.  Onychomycosis with pain  -Nails palliatively debrided as below. -Educated on self-care  Procedure: Nail Debridement Rationale: pain  Type of Debridement: manual, sharp debridement. Instrumentation: Nail nipper, rotary burr. Number of Nails: 10  Procedures and Treatment: Consent by patient was obtained for treatment procedures. The patient understood the discussion of treatment and procedures well. All questions were answered thoroughly reviewed. Debridement of mycotic and hypertrophic toenails, 1 through 5 bilateral and clearing of subungual debris. No ulceration, no infection noted.  Return Visit-Office Procedure: Patient instructed to return to the office for  a follow up visit 3 months for continued evaluation and treatment.  Nicholes Rough, DPM    No follow-ups on file.

## 2024-05-02 ENCOUNTER — Ambulatory Visit: Admitting: Podiatry

## 2024-05-02 DIAGNOSIS — B351 Tinea unguium: Secondary | ICD-10-CM | POA: Diagnosis not present

## 2024-05-02 DIAGNOSIS — M79675 Pain in left toe(s): Secondary | ICD-10-CM

## 2024-05-02 DIAGNOSIS — M79674 Pain in right toe(s): Secondary | ICD-10-CM

## 2024-05-02 NOTE — Progress Notes (Signed)
  Subjective:  Patient ID: Ricky Crawford, male    DOB: 1946/05/14,  MRN: [id number]  Chief Complaint  Patient presents with   Nail Problem    Nail trim    78 y.o. male returns for the above complaint.  Patient presents with thickened elongated dystrophic toenails x 10 mild pain on palpation hurts with ambulation hurts with pressure he would like for me to debride down has not able to do it himself  Objective:  There were no vitals filed for this visit. Podiatric Exam: Vascular: dorsalis pedis and posterior tibial pulses are palpable bilateral. Capillary return is immediate. Temperature gradient is WNL. Skin turgor WNL  Sensorium: Normal Semmes Weinstein monofilament test. Normal tactile sensation bilaterally. Nail Exam: Pt has thick disfigured discolored nails with subungual debris noted bilateral entire nail hallux through fifth toenails.  Pain on palpation to the nails. Ulcer Exam: There is no evidence of ulcer or pre-ulcerative changes or infection. Orthopedic Exam: Muscle tone and strength are WNL. No limitations in general ROM. No crepitus or effusions noted.  Skin: No Porokeratosis. No infection or ulcers    Assessment & Plan:   1. Pain due to onychomycosis of toenails of both feet       Patient was evaluated and treated and all questions answered.  Onychomycosis with pain  -Nails palliatively debrided as below. -Educated on self-care  Procedure: Nail Debridement Rationale: pain  Type of Debridement: manual, sharp debridement. Instrumentation: Nail nipper, rotary burr. Number of Nails: 10  Procedures and Treatment: Consent by patient was obtained for treatment procedures. The patient understood the discussion of treatment and procedures well. All questions were answered thoroughly reviewed. Debridement of mycotic and hypertrophic toenails, 1 through 5 bilateral and clearing of subungual debris. No ulceration, no infection noted.  Return Visit-Office Procedure: Patient  instructed to return to the office for a follow up visit 3 months for continued evaluation and treatment.  Tinnie Forehand, DPM    No follow-ups on file.

## 2024-08-01 ENCOUNTER — Ambulatory Visit: Admitting: Podiatry

## 2024-08-15 ENCOUNTER — Ambulatory Visit: Admitting: Podiatry

## 2024-08-15 DIAGNOSIS — B351 Tinea unguium: Secondary | ICD-10-CM

## 2024-08-15 DIAGNOSIS — M79674 Pain in right toe(s): Secondary | ICD-10-CM | POA: Diagnosis not present

## 2024-08-15 DIAGNOSIS — M79675 Pain in left toe(s): Secondary | ICD-10-CM

## 2024-08-15 NOTE — Progress Notes (Signed)
  Subjective:  Patient ID: Ricky Crawford, male    DOB: 12/26/1945,  MRN: [id number]  Chief Complaint  Patient presents with   Toe Pain    RFC   78 y.o. male returns for the above complaint.  Patient presents with thickened elongated dystrophic toenails x 10 mild pain on palpation hurts with ambulation hurts with pressure he would like for me to debride down has not able to do it himself  Objective:  There were no vitals filed for this visit. Podiatric Exam: Vascular: dorsalis pedis and posterior tibial pulses are palpable bilateral. Capillary return is immediate. Temperature gradient is WNL. Skin turgor WNL  Sensorium: Normal Semmes Weinstein monofilament test. Normal tactile sensation bilaterally. Nail Exam: Pt has thick disfigured discolored nails with subungual debris noted bilateral entire nail hallux through fifth toenails.  Pain on palpation to the nails. Ulcer Exam: There is no evidence of ulcer or pre-ulcerative changes or infection. Orthopedic Exam: Muscle tone and strength are WNL. No limitations in general ROM. No crepitus or effusions noted.  Skin: No Porokeratosis. No infection or ulcers    Assessment & Plan:   1. Pain due to onychomycosis of toenails of both feet        Patient was evaluated and treated and all questions answered.  Onychomycosis with pain  -Nails palliatively debrided as below. -Educated on self-care  Procedure: Nail Debridement Rationale: pain  Type of Debridement: manual, sharp debridement. Instrumentation: Nail nipper, rotary burr. Number of Nails: 10  Procedures and Treatment: Consent by patient was obtained for treatment procedures. The patient understood the discussion of treatment and procedures well. All questions were answered thoroughly reviewed. Debridement of mycotic and hypertrophic toenails, 1 through 5 bilateral and clearing of subungual debris. No ulceration, no infection noted.  Return Visit-Office Procedure: Patient  instructed to return to the office for a follow up visit 3 months for continued evaluation and treatment.  Franky Blanch, DPM    No follow-ups on file.

## 2024-11-09 ENCOUNTER — Ambulatory Visit: Admitting: Podiatry

## 2024-12-05 ENCOUNTER — Ambulatory Visit: Admitting: Podiatry

## 2024-12-12 ENCOUNTER — Ambulatory Visit: Admitting: Podiatry

## 2024-12-12 DIAGNOSIS — B351 Tinea unguium: Secondary | ICD-10-CM

## 2024-12-12 DIAGNOSIS — M79675 Pain in left toe(s): Secondary | ICD-10-CM | POA: Diagnosis not present

## 2024-12-12 DIAGNOSIS — M79674 Pain in right toe(s): Secondary | ICD-10-CM

## 2024-12-12 NOTE — Progress Notes (Signed)
"  °  Subjective:  Patient ID: Ricky Crawford, male    DOB: 1946/04/15,  MRN: [id number]  Chief Complaint  Patient presents with   Nail Problem    Nail trim    79 y.o. male returns for the above complaint.  Patient presents with thickened elongated dystrophic toenails x 10 mild pain on palpation hurts with ambulation hurts with pressure he would like for me to debride down has not able to do it himself  Objective:  There were no vitals filed for this visit. Podiatric Exam: Vascular: dorsalis pedis and posterior tibial pulses are palpable bilateral. Capillary return is immediate. Temperature gradient is WNL. Skin turgor WNL  Sensorium: Normal Semmes Weinstein monofilament test. Normal tactile sensation bilaterally. Nail Exam: Pt has thick disfigured discolored nails with subungual debris noted bilateral entire nail hallux through fifth toenails.  Pain on palpation to the nails. Ulcer Exam: There is no evidence of ulcer or pre-ulcerative changes or infection. Orthopedic Exam: Muscle tone and strength are WNL. No limitations in general ROM. No crepitus or effusions noted.  Skin: No Porokeratosis. No infection or ulcers    Assessment & Plan:   No diagnosis found.      Patient was evaluated and treated and all questions answered.  Onychomycosis with pain  -Nails palliatively debrided as below. -Educated on self-care  Procedure: Nail Debridement Rationale: pain  Type of Debridement: manual, sharp debridement. Instrumentation: Nail nipper, rotary burr. Number of Nails: 10  Procedures and Treatment: Consent by patient was obtained for treatment procedures. The patient understood the discussion of treatment and procedures well. All questions were answered thoroughly reviewed. Debridement of mycotic and hypertrophic toenails, 1 through 5 bilateral and clearing of subungual debris. No ulceration, no infection noted.  Return Visit-Office Procedure: Patient instructed to return to the  office for a follow up visit 3 months for continued evaluation and treatment.  Franky Blanch, DPM    No follow-ups on file.  "

## 2025-03-13 ENCOUNTER — Ambulatory Visit: Admitting: Podiatry
